# Patient Record
Sex: Male | Born: 1995 | Race: White | Hispanic: No | Marital: Single | Smoking: Never smoker
Health system: Southern US, Community
[De-identification: ages and names within clinical notes are randomized; demographics above are authoritative.]

---
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
## 2017-02-03 ENCOUNTER — Emergency Department: Payer: PRIVATE HEALTH INSURANCE

## 2017-02-03 ENCOUNTER — Emergency Department
Admission: EM | Admit: 2017-02-03 | Discharge: 2017-02-03 | Disposition: A | Discharge status: Home / Self Care | Payer: PRIVATE HEALTH INSURANCE | Attending: Emergency Medicine | Admitting: Emergency Medicine

## 2017-02-03 DIAGNOSIS — R0789 Other chest pain: Secondary | ICD-10-CM | POA: Insufficient documentation

## 2017-02-03 DIAGNOSIS — Y92410 Unspecified street and highway as the place of occurrence of the external cause: Secondary | ICD-10-CM | POA: Insufficient documentation

## 2017-02-03 DIAGNOSIS — Y9389 Activity, other specified: Secondary | ICD-10-CM | POA: Diagnosis not present

## 2017-02-03 DIAGNOSIS — M25512 Pain in left shoulder: Secondary | ICD-10-CM | POA: Insufficient documentation

## 2017-02-03 DIAGNOSIS — S40021A Contusion of right upper arm, initial encounter: Secondary | ICD-10-CM | POA: Insufficient documentation

## 2017-02-03 DIAGNOSIS — S4991XA Unspecified injury of right shoulder and upper arm, initial encounter: Secondary | ICD-10-CM | POA: Diagnosis present

## 2017-02-03 DIAGNOSIS — Y999 Unspecified external cause status: Secondary | ICD-10-CM | POA: Diagnosis not present

## 2017-02-03 MED ORDER — CYCLOBENZAPRINE HCL 5 MG PO TABS: 5.0000 mg | ORAL_TABLET | Freq: Three times a day (TID) | ORAL | 0 refills | Status: AC | PRN

## 2017-02-03 MED ORDER — IBUPROFEN 400 MG PO TABS
600.0000 mg | ORAL_TABLET | Freq: Once | ORAL | Status: AC
  Administered 2017-02-03: 600 mg via ORAL
  Filled 2017-02-03: qty 2

## 2017-02-03 MED ORDER — IBUPROFEN 600 MG PO TABS: 600.0000 mg | ORAL_TABLET | Freq: Four times a day (QID) | ORAL | 0 refills | Status: DC | PRN
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
## 2017-10-21 ENCOUNTER — Other Ambulatory Visit: Payer: Self-pay | Admitting: Orthopedic Surgery

## 2017-10-21 DIAGNOSIS — S46912D Strain of unspecified muscle, fascia and tendon at shoulder and upper arm level, left arm, subsequent encounter: Secondary | ICD-10-CM

## 2017-10-21 DIAGNOSIS — M25512 Pain in left shoulder: Secondary | ICD-10-CM

## 2017-10-21 DIAGNOSIS — G8929 Other chronic pain: Secondary | ICD-10-CM

## 2017-10-31 ENCOUNTER — Ambulatory Visit
Admission: RE | Admit: 2017-10-31 | Discharge: 2017-10-31 | Disposition: A | Discharge status: Home / Self Care | Payer: PRIVATE HEALTH INSURANCE | Source: Ambulatory Visit | Attending: Orthopedic Surgery | Admitting: Orthopedic Surgery

## 2017-10-31 DIAGNOSIS — S46912D Strain of unspecified muscle, fascia and tendon at shoulder and upper arm level, left arm, subsequent encounter: Secondary | ICD-10-CM | POA: Insufficient documentation

## 2017-10-31 DIAGNOSIS — X58XXXD Exposure to other specified factors, subsequent encounter: Secondary | ICD-10-CM | POA: Insufficient documentation

## 2017-10-31 DIAGNOSIS — G8929 Other chronic pain: Secondary | ICD-10-CM | POA: Diagnosis present

## 2017-10-31 DIAGNOSIS — M25512 Pain in left shoulder: Secondary | ICD-10-CM
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
## 2018-02-18 ENCOUNTER — Emergency Department: Payer: PRIVATE HEALTH INSURANCE

## 2018-02-18 ENCOUNTER — Other Ambulatory Visit: Payer: Self-pay

## 2018-02-18 ENCOUNTER — Encounter: Payer: Self-pay | Admitting: Emergency Medicine

## 2018-02-18 DIAGNOSIS — R0981 Nasal congestion: Secondary | ICD-10-CM | POA: Insufficient documentation

## 2018-02-18 DIAGNOSIS — R0789 Other chest pain: Secondary | ICD-10-CM | POA: Diagnosis present

## 2018-02-18 DIAGNOSIS — M940 Chondrocostal junction syndrome [Tietze]: Secondary | ICD-10-CM | POA: Insufficient documentation

## 2018-02-18 LAB — BASIC METABOLIC PANEL
Anion gap: 8 (ref 5–15)
BUN: 14 mg/dL (ref 6–20)
CALCIUM: 9 mg/dL (ref 8.9–10.3)
CO2: 27 mmol/L (ref 22–32)
CREATININE: 0.88 mg/dL (ref 0.61–1.24)
Chloride: 104 mmol/L (ref 101–111)
GFR calc Af Amer: >60 mL/min (ref >60)
GFR calc non Af Amer: >60 mL/min (ref >60)
GLUCOSE: 101 mg/dL — AB (ref 65–99)
Potassium: 3.8 mmol/L (ref 3.5–5.1)
Sodium: 139 mmol/L (ref 135–145)

## 2018-02-18 LAB — CBC
HCT: 40.3 % (ref 40.0–52.0)
HEMOGLOBIN: 14.3 g/dL (ref 13.0–18.0)
MCH: 29.6 pg (ref 26.0–34.0)
MCHC: 35.6 g/dL (ref 32.0–36.0)
MCV: 83.2 fL (ref 80.0–100.0)
PLATELETS: 270 10*3/uL (ref 150–440)
RBC: 4.84 MIL/uL (ref 4.40–5.90)
RDW: 12.7 % (ref 11.5–14.5)
WBC: 8.6 10*3/uL (ref 3.8–10.6)

## 2018-02-18 LAB — TROPONIN I
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
## 2018-02-19 ENCOUNTER — Emergency Department
Admission: EM | Admit: 2018-02-19 | Discharge: 2018-02-19 | Disposition: A | Discharge status: Home / Self Care | Payer: PRIVATE HEALTH INSURANCE | Attending: Emergency Medicine | Admitting: Emergency Medicine

## 2018-02-19 DIAGNOSIS — M940 Chondrocostal junction syndrome [Tietze]: Secondary | ICD-10-CM

## 2018-02-19 DIAGNOSIS — R0789 Other chest pain: Secondary | ICD-10-CM

## 2018-02-19 MED ORDER — IBUPROFEN 800 MG PO TABS: 800.0000 mg | ORAL_TABLET | Freq: Three times a day (TID) | ORAL | 0 refills | Status: AC | PRN

## 2018-02-19 MED ORDER — KETOROLAC TROMETHAMINE 30 MG/ML IJ SOLN
30.0000 mg | Freq: Once | INTRAMUSCULAR | Status: AC
  Administered 2018-02-19: 30 mg via INTRAMUSCULAR
  Filled 2018-02-19: qty 1

## 2018-02-19 MED ORDER — HYDROCODONE-ACETAMINOPHEN 5-325 MG PO TABS: 1.0000 | ORAL_TABLET | Freq: Four times a day (QID) | ORAL | 0 refills | Status: AC | PRN

## 2018-02-19 NOTE — Discharge Instructions (Addendum)
1.  You may take pain medicines as needed (Motrin/Norco #15). 2.  Apply moist heat to affected area several times daily. 3.  Return to the ER for worsening symptoms, persistent vomiting, difficulty breathing or other concerns. 

## 2018-02-19 NOTE — ED Provider Notes (Signed)
[facility] Emergency Department Provider Note   ____________________________________________   First MD Initiated Contact with Patient 02/19/18 [phone]     (approximate)  I have reviewed the triage vital signs and the nursing notes.   HISTORY  Chief Complaint Chest pain   HPI Richard Watkins is a 22 y.o. male who presents to the ED from home with a chief complaint of chest pain.  Patient reports a 1 day history of sharp left-sided chest pain wrapping around the side of his chest into his back.  Nonradiating, sharp chest pain which is exacerbated with movement.  Occasional shortness of breath but none currently.  Similar symptoms previously was diagnosed with "inflammation".  Mother reports patient has recent sinus congestion and pressure.  Denies fever, chills, abdominal pain, nausea, vomiting, diaphoresis.  Denies recent travel, trauma or hormone use.   Past medical history None  There are no active problems to display for this patient.   History reviewed. No pertinent surgical history.  Prior to Admission medications   Medication Sig Start Date End Date Taking? Authorizing Provider  cyclobenzaprine (FLEXERIL) 5 MG tablet Take 1 tablet (5 mg total) by mouth 3 (three) times daily as needed for muscle spasms. 02/03/17   Nita Sickle, MD  ibuprofen (ADVIL,MOTRIN) 600 MG tablet Take 1 tablet (600 mg total) by mouth every 6 (six) hours as needed. 02/03/17   Nita Sickle, MD    Allergies Omnicef [cefdinir]  No family history on file.  Social History Social History   Tobacco Use  . Smoking status: Never Smoker  . Smokeless tobacco: Never Used  Substance Use Topics  . Alcohol use: No  . Drug use: Not on file    Review of Systems  Constitutional: No fever/chills. Eyes: No visual changes. ENT: No sore throat. Cardiovascular: Positive for chest pain. Respiratory: Denies shortness of breath. Gastrointestinal: No abdominal pain.  No nausea,  no vomiting.  No diarrhea.  No constipation. Genitourinary: Negative for dysuria. Musculoskeletal: Negative for back pain. Skin: Negative for rash. Neurological: Negative for headaches, focal weakness or numbness.   ____________________________________________   PHYSICAL EXAM:  VITAL SIGNS: ED Triage Vitals  Enc Vitals Group     BP 02/18/18 2138 136/78     Pulse Rate 02/18/18 2138 90     Resp 02/18/18 2138 16     Temp 02/18/18 2138 98.2 F (36.8 C)     Temp Source 02/18/18 2138 Oral     SpO2 02/18/18 2138 96 %     Weight 02/18/18 2140 250 lb (113.4 kg)     Height 02/18/18 2140 6\' 2"  (1.88 m)     Head Circumference --      Peak Flow --      Pain Score 02/18/18 2143 5     Pain Loc --      Pain Edu? --      Excl. in [place]? --     Constitutional: Asleep, awakened for exam. Alert and oriented. Well appearing and in no acute distress. Eyes: Conjunctivae are normal. PERRL. EOMI. Head: Atraumatic. Nose: No congestion/rhinnorhea. Mouth/Throat: Mucous membranes are moist.  Oropharynx non-erythematous. Neck: No stridor.   Cardiovascular: Normal rate, regular rhythm. Grossly normal heart sounds.  Good peripheral circulation. Respiratory: Normal respiratory effort.  No retractions. Lungs CTAB. Anterior chest wall tender to palpation and with movement of trunk. Gastrointestinal: Soft and nontender. No distention. No abdominal bruits. No CVA tenderness. Musculoskeletal: No lower extremity tenderness nor edema.  No joint effusions. Neurologic:  Normal speech and language. No gross focal neurologic deficits are appreciated. No gait instability. Skin:  Skin is warm, dry and intact. No rash noted. Psychiatric: Mood and affect are normal. Speech and behavior are normal.  ____________________________________________   LABS (all labs ordered are listed, but only abnormal results are displayed)  Labs Reviewed  BASIC METABOLIC PANEL - Abnormal; Notable for the following components:       Result Value   Glucose, Bld 101 (*)    All other components within normal limits  CBC  TROPONIN I   ____________________________________________  EKG  ED ECG REPORT I, SUNG,JADE J, the attending physician, personally viewed and interpreted this ECG.   Date: 02/19/2018  EKG Time: 2143  Rate: 91  Rhythm: normal EKG, normal sinus rhythm  Axis: Normal  Intervals:none  ST&T Change: Nonspecific  ____________________________________________  RADIOLOGY  ED MD interpretation: No acute cardiopulmonary process  Official radiology report(s): Dg Chest 2 View  Result Date: 02/18/2018 CLINICAL DATA:  Chest pain EXAM: CHEST - 2 VIEW COMPARISON:  None. FINDINGS: The heart size and mediastinal contours are within normal limits. Both lungs are clear. The visualized skeletal structures are unremarkable. IMPRESSION: No active cardiopulmonary disease. Electronically Signed   By: Deatra RobinsonKevin  Herman M.D.   On: 02/18/2018 22:55    ____________________________________________   PROCEDURES  Procedure(s) performed: None  Procedures  Critical Care performed: No  ____________________________________________   INITIAL IMPRESSION / ASSESSMENT AND PLAN / ED COURSE  As part of my medical decision making, I reviewed the following data within the electronic MEDICAL RECORD NUMBER History obtained from family, Nursing notes reviewed and incorporated, Labs reviewed, EKG interpreted, Old chart reviewed, Radiograph reviewed and Notes from prior ED visits   22 year old healthy male who presents with chest wall pain. Differential diagnosis includes, but is not limited to, ACS, aortic dissection, pulmonary embolism, cardiac tamponade, pneumothorax, pneumonia, pericarditis, myocarditis, GI-related causes including esophagitis/gastritis, and musculoskeletal chest wall pain.    Laboratory, EKG and chest x-ray results unremarkable.  Will treat with NSAIDs, Norco and patient will follow up with his PCP this week.   Strict return precautions given.  Patient and mother verbalize understanding and agree with plan of care.      ____________________________________________   FINAL CLINICAL IMPRESSION(S) / ED DIAGNOSES  Final diagnoses:  Chest wall pain  Costochondritis     ED Discharge Orders    None       Note:  This document was prepared using Dragon voice recognition software and may include unintentional dictation errors.    Irean HongSung, Jade J, MD 02/19/18 [phone]

## 2018-02-19 NOTE — ED Notes (Signed)
Patient report pain "comes and goes". States pain had mostly subsided but came back when he sneezed. Reports pain increased with certain movements. Denies increased pain with deep inhalation.

## 2018-02-19 NOTE — ED Notes (Signed)
Patient ambulatory to lobby with NAD noted. Verbalized understanding of discharge instructions and follow-up care. 

## 2023-02-07 ENCOUNTER — Emergency Department
Admission: EM | Admit: 2023-02-07 | Discharge: 2023-02-07 | Disposition: A | Discharge status: Home / Self Care | Payer: PRIVATE HEALTH INSURANCE | Attending: Emergency Medicine | Admitting: Emergency Medicine

## 2023-02-07 ENCOUNTER — Emergency Department: Payer: PRIVATE HEALTH INSURANCE

## 2023-02-07 DIAGNOSIS — G43909 Migraine, unspecified, not intractable, without status migrainosus: Secondary | ICD-10-CM | POA: Diagnosis not present

## 2023-02-07 DIAGNOSIS — R079 Chest pain, unspecified: Secondary | ICD-10-CM | POA: Diagnosis present

## 2023-02-07 LAB — CBC
HCT: 44.8 % (ref 39.0–52.0)
Hemoglobin: 15.1 g/dL (ref 13.0–17.0)
MCH: 28.4 pg (ref 26.0–34.0)
MCHC: 33.7 g/dL (ref 30.0–36.0)
MCV: 84.4 fL (ref 80.0–100.0)
Platelets: 341 10*3/uL (ref 150–400)
RBC: 5.31 MIL/uL (ref 4.22–5.81)
RDW: 12.5 % (ref 11.5–15.5)
WBC: 11.4 10*3/uL — ABNORMAL HIGH (ref 4.0–10.5)
nRBC: 0 % (ref 0.0–0.2)

## 2023-02-07 LAB — TROPONIN I (HIGH SENSITIVITY): Troponin I (High Sensitivity): <2 ng/L (ref <18)

## 2023-02-07 LAB — BASIC METABOLIC PANEL
Anion gap: 10 (ref 5–15)
BUN: 14 mg/dL (ref 6–20)
CO2: 25 mmol/L (ref 22–32)
Calcium: 9.3 mg/dL (ref 8.9–10.3)
Chloride: 101 mmol/L (ref 98–111)
Creatinine, Ser: 0.77 mg/dL (ref 0.61–1.24)
GFR, Estimated: >60 mL/min (ref >60)
Glucose, Bld: 90 mg/dL (ref 70–99)
Potassium: 3.6 mmol/L (ref 3.5–5.1)
Sodium: 136 mmol/L (ref 135–145)

## 2023-02-07 MED ORDER — IBUPROFEN 400 MG PO TABS
400.0000 mg | ORAL_TABLET | Freq: Once | ORAL | Status: AC
  Administered 2023-02-07: 400 mg via ORAL
  Filled 2023-02-07: qty 1

## 2023-02-07 MED ORDER — DIPHENHYDRAMINE HCL 25 MG PO CAPS
50.0000 mg | ORAL_CAPSULE | Freq: Once | ORAL | Status: AC
  Administered 2023-02-07: 50 mg via ORAL
  Filled 2023-02-07: qty 2

## 2023-02-07 MED ORDER — SODIUM CHLORIDE 0.9 % IV BOLUS: 1000.0000 mL | Freq: Once | INTRAVENOUS | Status: DC

## 2023-02-07 MED ORDER — DIPHENHYDRAMINE HCL 50 MG/ML IJ SOLN: 25.0000 mg | Freq: Once | INTRAMUSCULAR | Status: DC

## 2023-02-07 MED ORDER — PROCHLORPERAZINE MALEATE 10 MG PO TABS
10.0000 mg | ORAL_TABLET | Freq: Once | ORAL | Status: AC
  Administered 2023-02-07: 10 mg via ORAL
  Filled 2023-02-07: qty 1

## 2023-02-07 MED ORDER — KETOROLAC TROMETHAMINE 30 MG/ML IJ SOLN: 15.0000 mg | Freq: Once | INTRAMUSCULAR | Status: DC

## 2023-02-07 MED ORDER — PROCHLORPERAZINE EDISYLATE 10 MG/2ML IJ SOLN: 10.0000 mg | Freq: Once | INTRAMUSCULAR | Status: DC

## 2023-02-07 NOTE — ED Triage Notes (Signed)
Pt presents ambulatory to triage via POV with complaints of L sided CP with associated L arm pain that started yesterday. Pt states he took 4 extra strength tylenol this AM without any improvement in his sx. Rates the pain 7/10 that is sharp and intermittent in nature. A&Ox4 at this time. Denies CP or SOB.

## 2023-02-07 NOTE — ED Provider Notes (Signed)
[facility] Provider Note    Event Date/Time   First MD Initiated Contact with Patient 02/07/23 2057     (approximate)   History   Chief Complaint Chest Pain   HPI  Richard Watkins is a 27 y.o. male with no significant past medical history presents to the ED complaining of chest pain.  Patient reports that he has been dealing with intermittent pain in the left side of his chest since yesterday morning.  He describes it as a dull aching with numbness and tingling extending down his left arm.  He states that the pain seems to be worse with exertion, when he will also feel slightly short of breath.  He denies any associated fevers or cough and has not had any pain or swelling in his legs.  He additionally complains of significant left-sided headache, which he describes as a migraine.  He reports some numbness and tingling in his left leg in addition to his left arm, denies weakness in any of his extremities and denies any vision changes or speech changes.  He reports a history of migraines, but does not typically have numbness with them.  He does report being under significant amount of stress lately due to break-up with his girlfriend.     Physical Exam   Triage Vital Signs: ED Triage Vitals  Enc Vitals Group     BP 02/07/23 2027 (!) 140/90     Pulse Rate 02/07/23 2027 80     Resp 02/07/23 2027 18     Temp 02/07/23 2027 98.7 F (37.1 C)     Temp src --      SpO2 02/07/23 2027 100 %     Weight 02/07/23 2027 280 lb (127 kg)     Height 02/07/23 2027 6\' 2"  (1.88 m)     Head Circumference --      Peak Flow --      Pain Score 02/07/23 2026 7     Pain Loc --      Pain Edu? --      Excl. in [place]? --     Most recent vital signs: Vitals:   02/07/23 2027 02/07/23 2317  BP: (!) 140/90 133/78  Pulse: 80 82  Resp: 18 18  Temp: 98.7 F (37.1 C) 98.7 F (37.1 C)  SpO2: 100% 100%    Constitutional: Alert and oriented. Eyes: Conjunctivae are normal.  Pupils  equal, round, and reactive to light bilaterally. Head: Atraumatic. Nose: No congestion/rhinnorhea. Mouth/Throat: Mucous membranes are moist.  Neck: Supple with no meningismus. Cardiovascular: Normal rate, regular rhythm. Grossly normal heart sounds.  2+ radial pulses bilaterally. Respiratory: Normal respiratory effort.  No retractions. Lungs CTAB. Gastrointestinal: Soft and nontender. No distention. Musculoskeletal: No lower extremity tenderness nor edema.  Neurologic:  Normal speech and language. No gross focal neurologic deficits are appreciated.    ED Results / Procedures / Treatments   Labs (all labs ordered are listed, but only abnormal results are displayed) Labs Reviewed  CBC - Abnormal; Notable for the following components:      Result Value   WBC 11.4 (*)    All other components within normal limits  BASIC METABOLIC PANEL  TROPONIN I (HIGH SENSITIVITY)     EKG  ED ECG REPORT I, Blake Divine, the attending physician, personally viewed and interpreted this ECG.   Date: 02/07/2023  EKG Time: 20:23  Rate: 78  Rhythm: normal sinus rhythm  Axis: Normal  Intervals:none  ST&T Change: None  RADIOLOGY Chest x-ray reviewed and interpreted by me with no infiltrate, edema, or effusion.  PROCEDURES:  Critical Care performed: No  Procedures   MEDICATIONS ORDERED IN ED: Medications  prochlorperazine (COMPAZINE) tablet 10 mg (10 mg Oral Given 02/07/23 2248)  diphenhydrAMINE (BENADRYL) capsule 50 mg (50 mg Oral Given 02/07/23 2228)  ibuprofen (ADVIL) tablet 400 mg (400 mg Oral Given 02/07/23 2228)     IMPRESSION / MDM / ASSESSMENT AND PLAN / ED COURSE  I reviewed the triage vital signs and the nursing notes.                              27 y.o. male with no significant past medical history who presents to the ED complaining of intermittent chest pain since yesterday associated with some mild difficulty breathing, headache, and left-sided numbness.  Patient's  presentation is most consistent with acute presentation with potential threat to life or bodily function.  Differential diagnosis includes, but is not limited to, ACS, PE, pneumonia, pneumothorax, GERD, musculoskeletal pain, anxiety, migraine headache, tension headache, SAH, meningitis, electrolyte abnormality, anemia.  Patient well-appearing and in no acute distress, vital signs are unremarkable.  He is not in any respiratory distress and maintaining oxygen saturations at 100% on room air.  EKG shows no evidence of arrhythmia or ischemia and troponin within normal limits, doubt ACS or PE given atypical symptoms with reassuring vital signs.  Patient is PERC negative.  Chest x-ray is also unremarkable, additional labs are reassuring with no significant anemia, leukocytosis, lecture abnormality, or AKI.  He complains of headache worsening since last night with left-sided numbness and tingling, but no focal weakness noted.  CT head performed and negative for acute process, low suspicion for stroke.  Neurologic symptoms seem to be improving following migraine cocktail which was given orally, as patient declined IV placement.  Father at bedside reports patient has been under significant stress due to relationship issues.  Patient is appropriate for discharge home with outpatient follow-up, will also be provided with outpatient psychiatric resources at request of patient and father.  He was counseled to return to the ED for new or worsening symptoms, patient agrees with plan.      FINAL CLINICAL IMPRESSION(S) / ED DIAGNOSES   Final diagnoses:  Nonspecific chest pain  Migraine without status migrainosus, not intractable, unspecified migraine type     Rx / DC Orders   ED Discharge Orders     None        Note:  This document was prepared using Dragon voice recognition software and may include unintentional dictation errors.   Blake Divine, MD 02/07/23 2330

## 2023-02-07 NOTE — ED Notes (Signed)
E-signature pad unavailable - Pt verbalized understanding of D/C information - no additional concerns at this time.  

## 2023-02-07 NOTE — ED Notes (Signed)
Attempted PIV in Right AC, + Blood return and PIV flushed. Pt stated that the PIV hurt so PIV was Dc'd.  RN and MD notified.
